# Patient Record
Sex: Male | Born: 1960
Health system: Southern US, Community
[De-identification: ages and names within clinical notes are randomized; demographics above are authoritative.]

---

## 2019-11-17 ENCOUNTER — Other Ambulatory Visit: Payer: Self-pay

## 2019-11-17 ENCOUNTER — Emergency Department (HOSPITAL_COMMUNITY)
Admission: EM | Admit: 2019-11-17 | Discharge: 2019-11-17 | Disposition: A | Discharge status: Home / Self Care | Payer: Medicaid - Out of State | Attending: Emergency Medicine | Admitting: Emergency Medicine

## 2019-11-17 ENCOUNTER — Encounter (HOSPITAL_COMMUNITY): Payer: Self-pay | Admitting: Emergency Medicine

## 2019-11-17 ENCOUNTER — Emergency Department (HOSPITAL_COMMUNITY): Payer: Medicaid - Out of State

## 2019-11-17 ENCOUNTER — Other Ambulatory Visit (HOSPITAL_COMMUNITY): Payer: Self-pay | Admitting: Emergency Medicine

## 2019-11-17 DIAGNOSIS — M5441 Lumbago with sciatica, right side: Secondary | ICD-10-CM | POA: Insufficient documentation

## 2019-11-17 DIAGNOSIS — M549 Dorsalgia, unspecified: Secondary | ICD-10-CM | POA: Diagnosis present

## 2019-11-17 DIAGNOSIS — M5431 Sciatica, right side: Secondary | ICD-10-CM

## 2019-11-17 MED ORDER — NAPROXEN 375 MG PO TABS: 375.0000 mg | ORAL_TABLET | Freq: Two times a day (BID) | ORAL | 0 refills | Status: DC

## 2019-11-17 MED ORDER — IBUPROFEN 800 MG PO TABS
800.0000 mg | ORAL_TABLET | Freq: Once | ORAL | Status: AC
  Administered 2019-11-17: 800 mg via ORAL
  Filled 2019-11-17: qty 1

## 2019-11-17 MED ORDER — METHOCARBAMOL 750 MG PO TABS: 750.0000 mg | ORAL_TABLET | Freq: Four times a day (QID) | ORAL | 0 refills | Status: DC

## 2019-11-17 MED ORDER — METHOCARBAMOL 500 MG PO TABS
1000.0000 mg | ORAL_TABLET | Freq: Once | ORAL | Status: AC
  Administered 2019-11-17: 1000 mg via ORAL
  Filled 2019-11-17: qty 2

## 2019-11-17 MED ORDER — DEXAMETHASONE SODIUM PHOSPHATE 10 MG/ML IJ SOLN
10.0000 mg | Freq: Once | INTRAMUSCULAR | Status: AC
  Administered 2019-11-17: 10 mg via INTRAMUSCULAR
  Filled 2019-11-17: qty 1

## 2019-11-17 MED ORDER — HYDROMORPHONE HCL 1 MG/ML IJ SOLN
1.0000 mg | Freq: Once | INTRAMUSCULAR | Status: AC
  Administered 2019-11-17: 1 mg via INTRAMUSCULAR
  Filled 2019-11-17: qty 1

## 2019-11-17 MED ORDER — DEXAMETHASONE SODIUM PHOSPHATE 10 MG/ML IJ SOLN: 10.0000 mg | Freq: Once | INTRAMUSCULAR | Status: DC

## 2019-11-17 NOTE — Discharge Planning (Signed)
  [facility] Medication Assistance Card Name: Jose Mejia ID (MRN): [id number]    Bin: [id number] RX Group: [id number] Discharge Date: 11/17/19 Expiration Date:11/25/19                                           (must be filled within 7 days of discharge)     You have been approved to have the prescriptions written by your discharging physician filled through our [facility] (Medication Assistance Through [facility]) program. This program allows for a one-time (no refills) 34-day supply of selected medications for a low copay amount.  The copay is $3.00 per prescription. For instance, if you have one prescription, you will pay $3.00; for two prescriptions, you pay $6.00; for three prescriptions, you pay $9.00; and so on.  Only certain pharmacies are participating in this program with [facility]. You will need to select one of the pharmacies from the attached list and take your prescriptions, this letter, and your photo ID to one of the [facility] pharmacies, [facility], [facility] at [address], or [facility] [address].   We are excited that you are able to use the [facility] program to get your medications. These prescriptions must be filled within 7 days of hospital discharge or they will no longer be valid for the [facility] program. Should you have any problems with your prescriptions please contact your case management team member at [phone] for [place]/ [facility].  Thank you, [facility]

## 2019-11-17 NOTE — ED Triage Notes (Signed)
Pt BIB [facility] from home. Pt complaining of right leg pain that starts in his lower back and shoots down his leg. VSS. NAD.

## 2019-11-17 NOTE — ED Provider Notes (Signed)
[facility] EMERGENCY DEPARTMENT Provider Note   CSN: [id number] Arrival date & time: 11/17/19  1601     History Chief Complaint  Patient presents with  . Leg Pain    Jose Mejia is a 59 y.o. male.  HPI Patient reports he has moved to the [place] area from [place] about 5 months ago.  He has not established with any primary care provider.  Patient reports he has had problems with back pain on and off for a number of years.  He has no history of back surgeries.  He denies history of prior back injections.  He reports he has gotten a lot of aching now in the lower back and indicates the right SI region.  He reports pain shoots down into the back of his leg.  He reports his very painful and sometimes he cannot find a comfortable position.  He has not had weakness or inability to walk but due to pain finds it difficult.  No numbness or coolness to the foot.  No abdominal pain.  No urinary symptoms.  No pain burning urgency or difficulty to go.  Patient denies any medical history.  He denies any medications that are prescribed that he is supposed to take.    History reviewed. No pertinent past medical history.  There are no problems to display for this patient.   History reviewed. No pertinent surgical history.     No family history on file.  Social History   Tobacco Use  . Smoking status: Not on file  Substance Use Topics  . Alcohol use: Not on file  . Drug use: Not on file    Home Medications Prior to Admission medications   Medication Sig Start Date End Date Taking? Authorizing Provider  methocarbamol (ROBAXIN-750) 750 MG tablet Take 1 tablet (750 mg total) by mouth 4 (four) times daily. 11/17/19   Arby Barrette, MD  naproxen (NAPROSYN) 375 MG tablet Take 1 tablet (375 mg total) by mouth 2 (two) times daily. 11/17/19   Arby Barrette, MD    Allergies    Patient has no known allergies.  Review of Systems   Review of Systems 10  systems reviewed and negative except as per HPI Physical Exam Updated Vital Signs BP (!) 151/102 (BP Location: Right Arm)   Pulse 78   Temp (!) 97.4 F (36.3 C) (Oral)   Resp 20   SpO2 100%   Physical Exam Constitutional:      Appearance: Normal appearance. He is well-developed.  HENT:     Head: Normocephalic and atraumatic.  Cardiovascular:     Rate and Rhythm: Normal rate and regular rhythm.     Heart sounds: Normal heart sounds.  Pulmonary:     Effort: Pulmonary effort is normal.     Breath sounds: Normal breath sounds.  Abdominal:     General: Bowel sounds are normal. There is no distension.     Palpations: Abdomen is soft.     Tenderness: There is no abdominal tenderness.  Musculoskeletal:        General: Normal range of motion.     Cervical back: Neck supple.     Comments: No focally reproducible pain to palpation of the bony prominences of the back.  Skin:    General: Skin is warm and dry.  Neurological:     General: No focal deficit present.     Mental Status: He is alert and oriented to person, place, and time.  GCS: GCS eye subscore is 4. GCS verbal subscore is 5. GCS motor subscore is 6.     Motor: No weakness.     Coordination: Coordination normal.     Comments: Intact flexion extension strength both lower extremities.  Patient elevate and hold both lower extremities off the bed against resistance.     ED Results / Procedures / Treatments   Labs (all labs ordered are listed, but only abnormal results are displayed) Labs Reviewed - No data to display  EKG None  Radiology DG Lumbar Spine Complete  Result Date: 11/17/2019 CLINICAL DATA:  Back pain and right hip pain EXAM: LUMBAR SPINE - COMPLETE 4+ VIEW COMPARISON:  None. FINDINGS: Five lumbar type vertebral segments. Vertebral body heights and alignment are maintained. No fracture identified. Mild degenerative disc disease, most pronounced L4-5. Multilevel degenerative endplate changes. Lower lumbar  facet arthrosis. Advanced degenerative changes of the visualized portions of the bilateral hip joints with joint space loss and large subchondral cystic changes. IMPRESSION: 1. 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 2. 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 Electronically Signed   By: Duanne Guess D.O.   On: 11/17/2019 11:47    Procedures Procedures (including critical care time)  Medications Ordered in ED Medications  HYDROmorphone (DILAUDID) injection 1 mg (1 mg Intramuscular Given 11/17/19 1148)  ibuprofen (ADVIL) tablet 800 mg (800 mg Oral Given 11/17/19 1146)  methocarbamol (ROBAXIN) tablet 1,000 mg (1,000 mg Oral Given 11/17/19 1146)  dexamethasone (DECADRON) injection 10 mg (10 mg Intramuscular Given 11/17/19 1147)    ED Course  I have reviewed the triage vital signs and the nursing notes.  Pertinent labs & imaging results that were available during my care of the patient were reviewed by me and considered in my medical decision making (see chart for details).    MDM Rules/Calculators/A&P                         Patient presents with complaint of lower back pain in the SI region radiating into the right leg.  At this time no sign of neurologic weakness.  No sign of vascular dysfunction.  Calf is soft and warm.  No peripheral edema.  Patient got improved pain with administration of Dilaudid and Decadron.  Plan will be to treat for sciatica with 1 shot of Decadron and oral anti-inflammatories and muscle relaxers on outpatient basis.  Patient is counseled on need for follow-up plan.  Outpatient resources are given for establishing PCP.  Return precautions provided. Final Clinical Impression(s) / ED Diagnoses Final diagnoses:  Sciatica of right side    Rx / DC Orders ED Discharge Orders         Ordered    naproxen (NAPROSYN) 375 MG tablet  2 times daily         11/17/19 1246    methocarbamol (ROBAXIN-750) 750 MG tablet  4 times daily        11/17/19 1246           Arby Barrette, MD 11/17/19 1440

## 2019-11-17 NOTE — Discharge Instructions (Addendum)
1.  You have been given a dose of Decadron in the emergency department.  This is a steroid shot.  This will continue to work over the next several days to help with inflammation. 2.  You may start taking naproxen twice daily starting this evening.  Always take with food.  You may take Robaxin up to 4 times daily if needed for a muscle relaxer. 3.  Get established with a primary care doctor soon as possible.  You have been given information for [facility] and wellness.  You may also use the referral number in your discharge instructions.  You have also been given a resource guide for low-cost medical care locally.

## 2019-11-18 ENCOUNTER — Telehealth: Payer: Self-pay | Admitting: *Deleted

## 2019-11-18 MED FILL — NAPROXEN 375 MG TABLET: 375 | 10 days supply | Qty: 20 | Fill #0

## 2019-11-18 MED FILL — METHOCARBAMOL 750 MG TABS: 750 | 8 days supply | Qty: 30 | Fill #0

## 2019-11-18 NOTE — Telephone Encounter (Signed)
RNCM contacted by [facility] requesting an override on  Rx co-pay.  RNCM placed override as requested.

## 2019-12-03 ENCOUNTER — Ambulatory Visit: Payer: Medicaid - Out of State | Admitting: Surgery

## 2019-12-09 ENCOUNTER — Ambulatory Visit: Payer: Medicaid - Out of State | Admitting: Surgery

## 2019-12-15 ENCOUNTER — Inpatient Hospital Stay: Payer: Medicaid - Out of State | Admitting: Nurse Practitioner

## 2021-11-20 IMAGING — DX DG LUMBAR SPINE COMPLETE 4+V
5 series · 5 of 5 positions shown · non-contrast
Comparison: None.

CLINICAL DATA: Back pain and right hip pain

EXAM:
LUMBAR SPINE - COMPLETE 4+ VIEW

[l-spine ap]
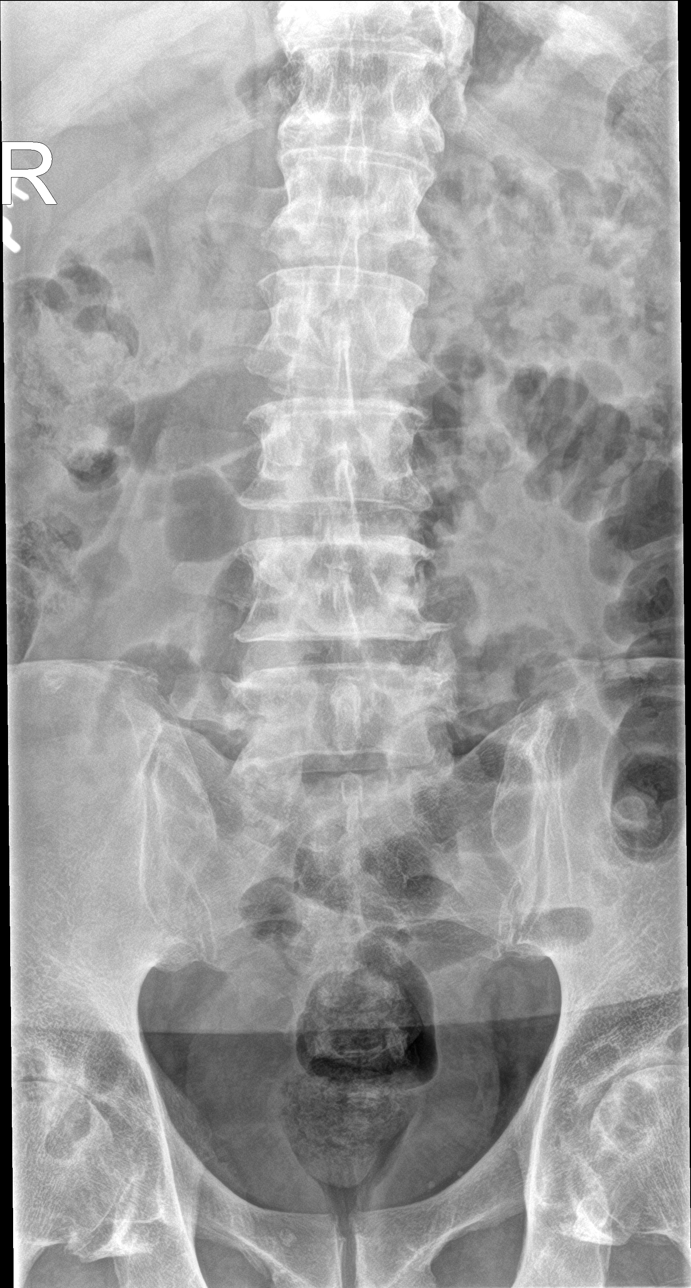

[l-spine obl (1 of 2)]
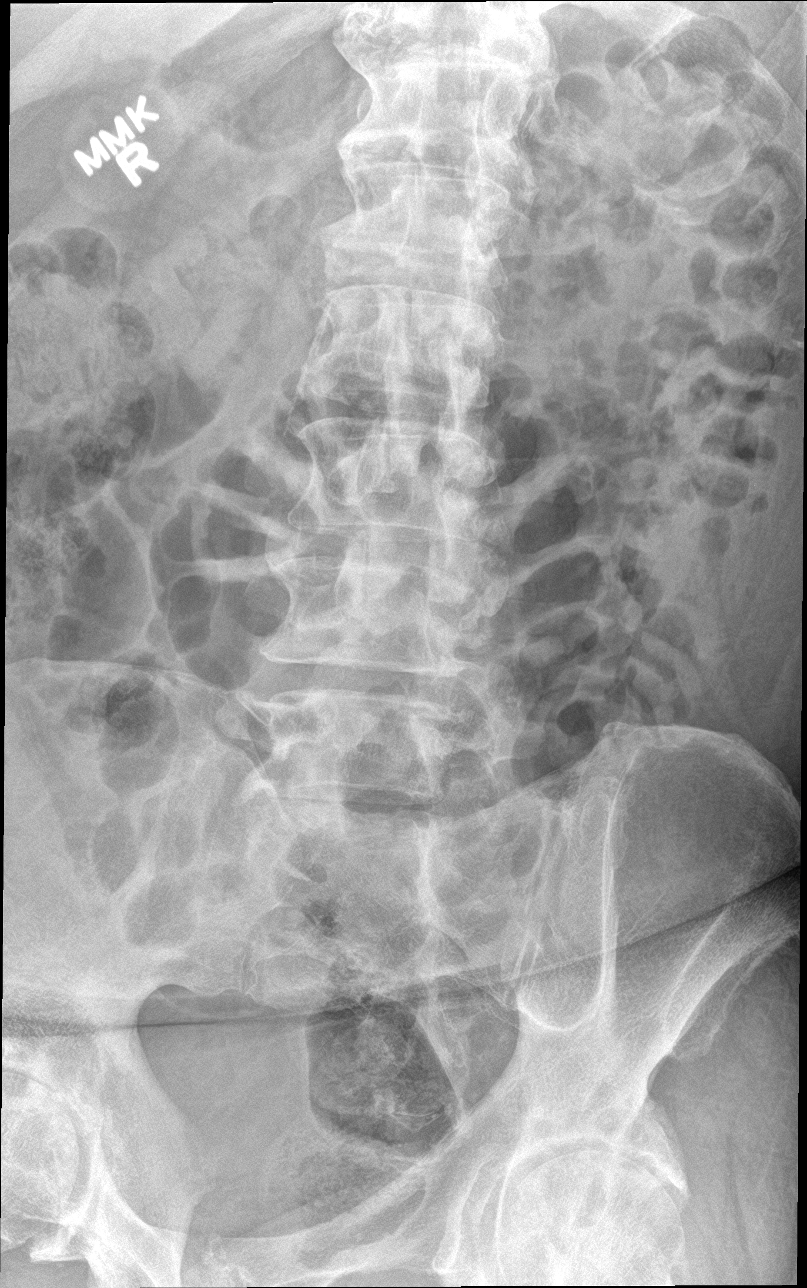

[l-spine obl (2 of 2)]
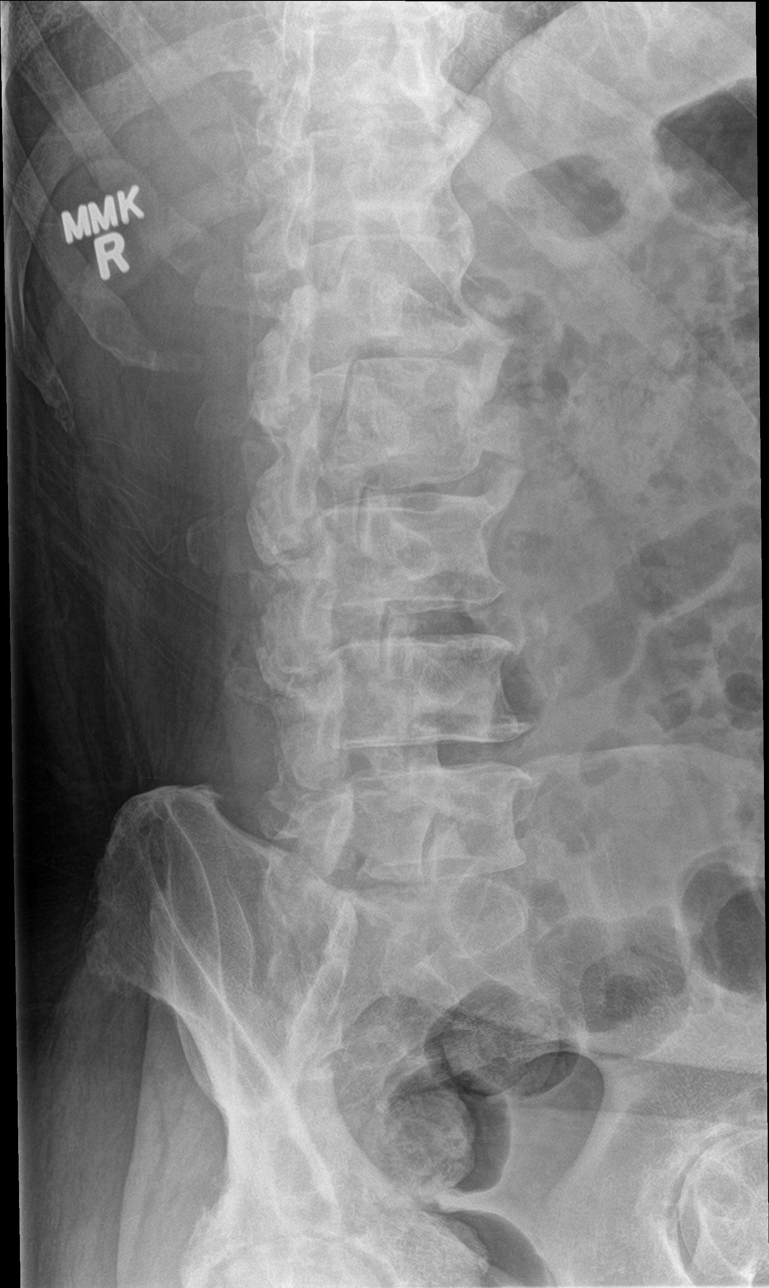

[l-spine lat]
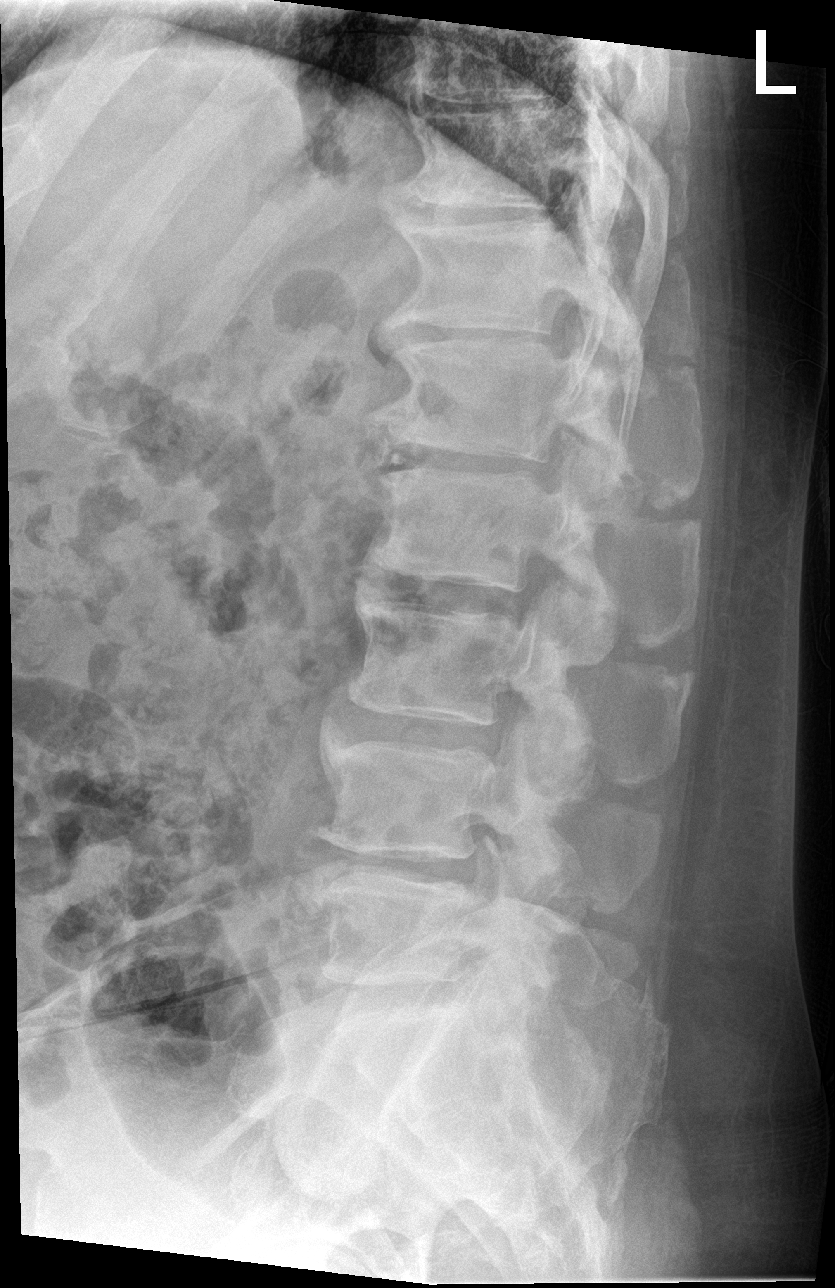

[l-spine spot]
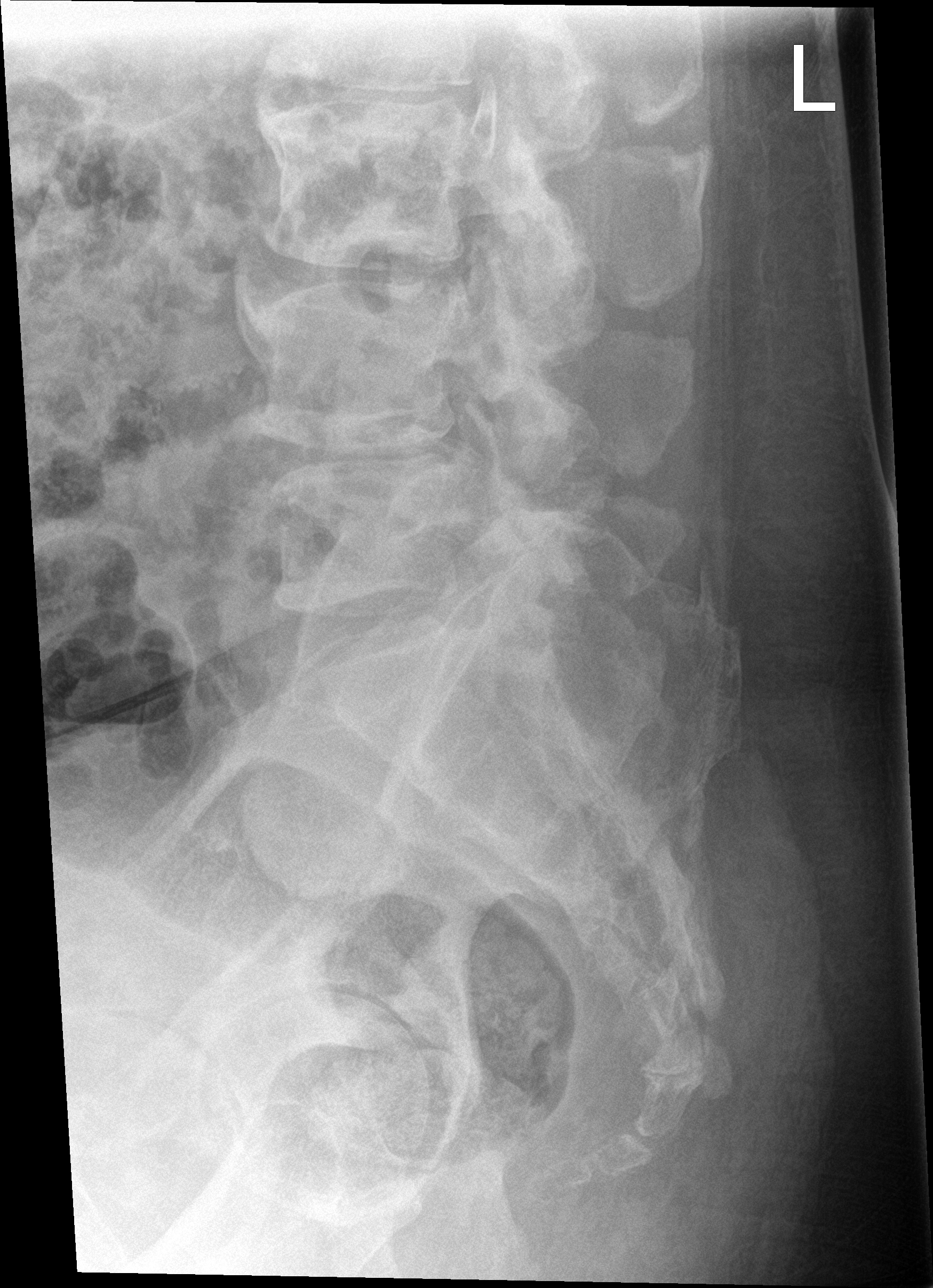

[5 of 5 positions shown; findings below may reference images not displayed]

FINDINGS: Five lumbar type vertebral segments. Vertebral body heights and
alignment are maintained. No fracture identified. Mild degenerative
disc disease, most pronounced L4-5. Multilevel degenerative endplate
changes. Lower lumbar facet arthrosis. Advanced degenerative changes
of the visualized portions of the bilateral hip joints with joint
space loss and large subchondral cystic changes.
IMPRESSION: 1. Mild multilevel degenerative disc disease and lower lumbar facet
arthrosis.
2. Advanced degenerative changes of the bilateral hips with joint
space loss and large subchondral cysts, right worse than left.
